# Patient Record
Sex: Male | Born: 2000
Health system: Southern US, Community
[De-identification: ages and names within clinical notes are randomized; demographics above are authoritative.]

## PROBLEM LIST (undated history)

## (undated) DIAGNOSIS — J45909 Unspecified asthma, uncomplicated: Secondary | ICD-10-CM

---
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
## 2022-02-10 ENCOUNTER — Ambulatory Visit
Admission: EM | Admit: 2022-02-10 | Discharge: 2022-02-10 | Disposition: A | Discharge status: Home / Self Care | Payer: BC Managed Care – PPO | Attending: Emergency Medicine | Admitting: Emergency Medicine

## 2022-02-10 DIAGNOSIS — J029 Acute pharyngitis, unspecified: Secondary | ICD-10-CM | POA: Diagnosis present

## 2022-02-10 DIAGNOSIS — B9689 Other specified bacterial agents as the cause of diseases classified elsewhere: Secondary | ICD-10-CM

## 2022-02-10 DIAGNOSIS — J028 Acute pharyngitis due to other specified organisms: Secondary | ICD-10-CM

## 2022-02-10 LAB — POCT RAPID STREP A (OFFICE): Rapid Strep A Screen: NEGATIVE

## 2022-02-10 MED ORDER — IBUPROFEN 800 MG PO TABS
800.0000 mg | ORAL_TABLET | Freq: Once | ORAL | Status: AC
  Administered 2022-02-10: 800 mg via ORAL

## 2022-02-10 MED ORDER — LIDOCAINE VISCOUS HCL 2 % MT SOLN: 15.0000 mL | OROMUCOSAL | 0 refills | Status: DC | PRN

## 2022-02-10 MED ORDER — IBUPROFEN 600 MG PO TABS: 600.0000 mg | ORAL_TABLET | Freq: Three times a day (TID) | ORAL | 0 refills | Status: AC | PRN

## 2022-02-10 MED ORDER — AMOXICILLIN-POT CLAVULANATE 875-125 MG PO TABS: 1.0000 | ORAL_TABLET | Freq: Two times a day (BID) | ORAL | 0 refills | Status: AC

## 2022-02-10 NOTE — ED Provider Notes (Signed)
[facility]    CSN: [id number] Arrival date & time: 02/10/22  1335    HISTORY   Chief Complaint  Patient presents with   Sore Throat   Fever   HPI Kenneth Shepherd is a pleasant, 21 y.o. male who presents to urgent care today. The pt c/o sore throat and subjective fever that started yesterday.  Patient states has not tried anything for his symptoms.  Patient complains of severe pain with swallowing, muffled voice, discomfort when talking.  Patient denies neck pain, ear pain, congestion, cough, body aches, chills, nausea, vomiting, diarrhea, headache.  Patient denies known sick contacts.  The history is provided by the patient.   Past Medical History:  Diagnosis Date   Asthma    There are no problems to display for this patient.  History reviewed. No pertinent surgical history.  Home Medications    Prior to Admission medications   Not on File    Family History History reviewed. No pertinent family history. Social History Social History   Tobacco Use   Smoking status: Never   Smokeless tobacco: Never  Substance Use Topics   Alcohol use: Never   Drug use: Never   Allergies   Keflex [cephalexin]  Review of Systems Review of Systems Pertinent findings revealed after performing a 14 point review of systems has been noted in the history of present illness.  Physical Exam Triage Vital Signs ED Triage Vitals  Enc Vitals Group     BP 04/19/21 0827 (!) 147/82     Pulse Rate 04/19/21 0827 72     Resp 04/19/21 0827 18     Temp 04/19/21 0827 98.3 F (36.8 C)     Temp Source 04/19/21 0827 Oral     SpO2 04/19/21 0827 98 %     Weight --      Height --      Head Circumference --      Peak Flow --      Pain Score 04/19/21 0826 5     Pain Loc --      Pain Edu? --      Excl. in GC? --   No data found.  Updated Vital Signs BP 112/60 (BP Location: Right Arm)   Pulse 91   Temp 99.8 F (37.7 C) (Oral)   Resp 16   SpO2 96%   Physical Exam Vitals and  nursing note reviewed.  Constitutional:      General: He is awake. He is not in acute distress.    Appearance: Normal appearance. He is well-developed, well-groomed and normal weight. He is ill-appearing. He is not toxic-appearing.  HENT:     Head: Normocephalic and atraumatic.     Jaw: There is normal jaw occlusion.     Salivary Glands: Right salivary gland is diffusely enlarged and tender. Left salivary gland is diffusely enlarged and tender.     Right Ear: Hearing and external ear normal.     Left Ear: Hearing and external ear normal.     Ears:     Comments: Bilateral EACs with mild erythema, bilateral TMs are normal    Nose: No mucosal edema, congestion or rhinorrhea.     Right Turbinates: Not enlarged, swollen or pale.     Left Turbinates: Not enlarged or swollen.     Right Sinus: No maxillary sinus tenderness or frontal sinus tenderness.     Left Sinus: No maxillary sinus tenderness or frontal sinus tenderness.     Mouth/Throat:  Lips: Pink. No lesions.     Mouth: Mucous membranes are moist. No oral lesions or angioedema.     Dentition: No gingival swelling.     Tongue: No lesions.     Palate: No mass.     Pharynx: Uvula midline. Pharyngeal swelling, posterior oropharyngeal erythema and uvula swelling (With erythema) present. No oropharyngeal exudate.     Tonsils: No tonsillar exudate. 2+ on the right. 2+ on the left.  Eyes:     Extraocular Movements: Extraocular movements intact.     Conjunctiva/sclera: Conjunctivae normal.     Pupils: Pupils are equal, round, and reactive to light.  Neck:     Thyroid: No thyroid mass, thyromegaly or thyroid tenderness.     Trachea: Tracheal tenderness present. No abnormal tracheal secretions or tracheal deviation.     Comments: Voice is muffled Cardiovascular:     Rate and Rhythm: Normal rate and regular rhythm.     Pulses: Normal pulses.     Heart sounds: Normal heart sounds, S1 normal and S2 normal. No murmur heard.    No friction  rub. No gallop.  Pulmonary:     Effort: Pulmonary effort is normal. No accessory muscle usage, prolonged expiration, respiratory distress or retractions.     Breath sounds: No stridor, decreased air movement or transmitted upper airway sounds. No decreased breath sounds, wheezing, rhonchi or rales.  Abdominal:     General: Bowel sounds are normal.     Palpations: Abdomen is soft.     Tenderness: There is generalized abdominal tenderness. There is no right CVA tenderness, left CVA tenderness or rebound. Negative signs include Murphy's sign.     Hernia: No hernia is present.  Musculoskeletal:        General: No tenderness. Normal range of motion.     Cervical back: Full passive range of motion without pain, normal range of motion and neck supple.     Right lower leg: No edema.     Left lower leg: No edema.  Lymphadenopathy:     Cervical: Cervical adenopathy present.     Right cervical: Superficial cervical adenopathy and deep cervical adenopathy present.     Left cervical: Superficial cervical adenopathy and deep cervical adenopathy present.  Skin:    General: Skin is warm and dry.     Findings: No erythema, lesion or rash.  Neurological:     General: No focal deficit present.     Mental Status: He is alert and oriented to person, place, and time. Mental status is at baseline.  Psychiatric:        Mood and Affect: Mood normal.        Behavior: Behavior normal. Behavior is cooperative.        Thought Content: Thought content normal.        Judgment: Judgment normal.     Visual Acuity Right Eye Distance:   Left Eye Distance:   Bilateral Distance:    Right Eye Near:   Left Eye Near:    Bilateral Near:     [facility] Couse / Diagnostics / Procedures:     Radiology No results found.  Procedures Procedures (including critical care time) EKG  Pending results:  Labs Reviewed  CULTURE, GROUP A STREP [facility])  POCT RAPID STREP A (OFFICE)    Medications Ordered in UC: Medications   ibuprofen (ADVIL) tablet 800 mg (800 mg Oral Given 02/10/22 1358)    [facility] Diagnoses / Final Clinical Impressions(s)   I have reviewed the triage vital  signs and the nursing notes.  Pertinent labs & imaging results that were available during my care of the patient were reviewed by me and considered in my medical decision making (see chart for details).    Final diagnoses:  Sore throat  Acute bacterial pharyngitis   Rapid strep test today is negative, throat culture pending.  Patient provided with cefdinir for 10 days for treatment of bacterial pharyngitis.  Patient provided with lidocaine to swish and swallow for throat pain as well as ibuprofen 600 mg 3 times daily as needed.  Patient further advised that it is likely that his infection is due to staph instead of strep and that the throat culture will be negative.  Patient advised that if he is feeling better after 24 to 36 hours of antibiotics he should complete the full 10-day course regardless of the results of the strep culture.  Return precautions advised.  ED Prescriptions     Medication Sig Dispense Auth. Provider   ibuprofen (ADVIL) 600 MG tablet Take 1 tablet (600 mg total) by mouth every 8 (eight) hours as needed for up to 30 doses for fever, headache, mild pain or moderate pain (Inflammation). Take 1 tablet 3 times daily as needed for inflammation of upper airways and/or pain. 30 tablet Theadora Rama Scales, PA-C   lidocaine (XYLOCAINE) 2 % solution Use as directed 15 mLs in the mouth or throat every 3 (three) hours as needed for mouth pain (Sore throat). 300 mL Theadora Rama Scales, PA-C   amoxicillin-clavulanate (AUGMENTIN) 875-125 MG tablet Take 1 tablet by mouth every 12 (twelve) hours for 10 days. 20 tablet Theadora Rama Scales, PA-C      PDMP not reviewed this encounter.  Disposition Upon Discharge:  Condition: stable for discharge home Home: take medications as prescribed; routine discharge instructions as discussed;  follow up as advised.  Patient presented with an acute illness with associated systemic symptoms and significant discomfort requiring urgent management. In my opinion, this is a condition that a prudent lay person (someone who possesses an average knowledge of health and medicine) may potentially expect to result in complications if not addressed urgently such as respiratory distress, impairment of bodily function or dysfunction of bodily organs.   Routine symptom specific, illness specific and/or disease specific instructions were discussed with the patient and/or caregiver at length.   As such, the patient has been evaluated and assessed, work-up was performed and treatment was provided in alignment with urgent care protocols and evidence based medicine.  Patient/parent/caregiver has been advised that the patient may require follow up for further testing and treatment if the symptoms continue in spite of treatment, as clinically indicated and appropriate.  If the patient was tested for COVID-19, Influenza and/or RSV, then the patient/parent/guardian was advised to isolate at home pending the results of his/her diagnostic coronavirus test and potentially longer if they're positive. I have also advised pt that if his/her COVID-19 test returns positive, it's recommended to self-isolate for at least 10 days after symptoms first appeared AND until fever-free for 24 hours without fever reducer AND other symptoms have improved or resolved. Discussed self-isolation recommendations as well as instructions for household member/close contacts as per the [facility] and [place] [facility], and also gave patient the COVID packet with this information.  Patient/parent/caregiver has been advised to return to the [facility] or PCP in 3-5 days if no better; to PCP or the Emergency Department if new signs and symptoms develop, or if the current signs or symptoms continue to  change or worsen for further workup, evaluation and treatment as  clinically indicated and appropriate  The patient will follow up with their current PCP if and as advised. If the patient does not currently have a PCP we will assist them in obtaining one.   The patient may need specialty follow up if the symptoms continue, in spite of conservative treatment and management, for further workup, evaluation, consultation and treatment as clinically indicated and appropriate.  Patient/parent/caregiver verbalized understanding and agreement of plan as discussed.  All questions were addressed during visit.  Please see discharge instructions below for further details of plan.  Discharge Instructions:   Discharge Instructions      Your strep test today is negative.  Streptococcal throat culture will be performed per our protocol, please keep in mind that the rapid strep test that we perform here at urgent care only catches 40% of strep throat infections.   Based on my physical exam findings and the history you provided to me today, I recommend that you begin antibiotics now for presumed strep throat instead of waiting for the strep culture result.  I have sent a prescription to your pharmacy.  After 24 hours of antibiotics, you should begin to feel significantly better.     After 24 hours of taking antibiotics, please discard your toothbrush as well as any other oral devices that you are currently using and replace them with new ones to avoid reinfection.   If your streptococcal throat culture has a negative result but you feel significantly better after taking antibiotics for 24 to 48 hours, I strongly recommend that you finish the full 10-day course.  Bacterial culture tests are only as reliable as the laboratory technician performing them and they only look for strep, not any of the other common bacteria that causes bacterial infections in our throat.    Alternately, if your streptococcal throat culture has a negative result and you see no improvement of your  symptoms after 24 to 48 hours of antibiotics, please discontinue the antibiotics as they are no longer indicated and will be of no benefit.  Your throat infection will then be most likely considered due to one of the many viruses circulating in our community right now and will simply have to run its course.     Please see the list below for recommended medications, dosages and frequencies to provide relief of your current symptoms:    Augmentin (amoxicillin/clavulanate): For the infection in your throat, please take 1 capsule twice daily for 10 days, you can take it with or without food.  This antibiotic can cause upset stomach, this will resolve once antibiotics are complete.  You are welcome to use a probiotic, eat yogurt, take Imodium while taking this medication.  Please avoid other systemic medications such as Maalox, Pepto-Bismol or milk of magnesia as they can interfere with your body's ability to absorb the antibiotics.  If you have relief of your symptoms after 24 to 36 hours of taking Omnicef, please remember that it is very important that you take antibiotics as prescribed.  If you skip doses or do not complete the full course of antibiotics, you put yourself at significant risk of recurrent infection which can often be worse than your initial infection.  I have sent a prescription to your pharmacy.   Advil, Motrin (ibuprofen): This is a good anti-inflammatory medication which addresses aches, pains and inflammation of the upper airways that causes sinus and nasal congestion as well as in the  lower airways which makes your cough feel tight and sometimes burn.  I recommend that you take 600 mg every 6-8 hours as needed for throat pain.  I have sent a prescription to your pharmacy.   Xylocaine (lidocaine): This is a numbing medication that can be swished for 15 seconds and swallowed.  You can use this every 3 hours while awake to relieve pain in your mouth and throat.  I have sent a prescription for  this medication to your pharmacy.   Please follow-up within the next 7-10 days either with your primary care provider or urgent care if your symptoms do not resolve.  If you do not have a primary care provider, we will assist you in finding one.   Thank you for visiting urgent care today.  We appreciate the opportunity to participate in your care.       This office note has been dictated using Teaching laboratory technician.  Unfortunately, this method of dictation can sometimes lead to typographical or grammatical errors.  I apologize for your inconvenience in advance if this occurs.  Please do not hesitate to reach out to me if clarification is needed.      Theadora Rama Scales, PA-C 02/10/22 1538

## 2022-02-10 NOTE — Discharge Instructions (Addendum)
Your strep test today is negative.  Streptococcal throat culture will be performed per our protocol, please keep in mind that the rapid strep test that we perform here at urgent care only catches 40% of strep throat infections.   Based on my physical exam findings and the history you provided to me today, I recommend that you begin antibiotics now for presumed strep throat instead of waiting for the strep culture result.  I have sent a prescription to your pharmacy.  After 24 hours of antibiotics, you should begin to feel significantly better.     After 24 hours of taking antibiotics, please discard your toothbrush as well as any other oral devices that you are currently using and replace them with new ones to avoid reinfection.   If your streptococcal throat culture has a negative result but you feel significantly better after taking antibiotics for 24 to 48 hours, I strongly recommend that you finish the full 10-day course.  Bacterial culture tests are only as reliable as the laboratory technician performing them and they only look for strep, not any of the other common bacteria that causes bacterial infections in our throat.    Alternately, if your streptococcal throat culture has a negative result and you see no improvement of your symptoms after 24 to 48 hours of antibiotics, please discontinue the antibiotics as they are no longer indicated and will be of no benefit.  Your throat infection will then be most likely considered due to one of the many viruses circulating in our community right now and will simply have to run its course.     Please see the list below for recommended medications, dosages and frequencies to provide relief of your current symptoms:    Augmentin (amoxicillin/clavulanate): For the infection in your throat, please take 1 capsule twice daily for 10 days, you can take it with or without food.  This antibiotic can cause upset stomach, this will resolve once antibiotics are  complete.  You are welcome to use a probiotic, eat yogurt, take Imodium while taking this medication.  Please avoid other systemic medications such as Maalox, Pepto-Bismol or milk of magnesia as they can interfere with your body's ability to absorb the antibiotics.  If you have relief of your symptoms after 24 to 36 hours of taking Omnicef, please remember that it is very important that you take antibiotics as prescribed.  If you skip doses or do not complete the full course of antibiotics, you put yourself at significant risk of recurrent infection which can often be worse than your initial infection.  I have sent a prescription to your pharmacy.   Advil, Motrin (ibuprofen): This is a good anti-inflammatory medication which addresses aches, pains and inflammation of the upper airways that causes sinus and nasal congestion as well as in the lower airways which makes your cough feel tight and sometimes burn.  I recommend that you take 600 mg every 6-8 hours as needed for throat pain.  I have sent a prescription to your pharmacy.   Xylocaine (lidocaine): This is a numbing medication that can be swished for 15 seconds and swallowed.  You can use this every 3 hours while awake to relieve pain in your mouth and throat.  I have sent a prescription for this medication to your pharmacy.   Please follow-up within the next 7-10 days either with your primary care provider or urgent care if your symptoms do not resolve.  If you do not have a primary care  provider, we will assist you in finding one.   Thank you for visiting urgent care today.  We appreciate the opportunity to participate in your care.

## 2022-02-10 NOTE — ED Triage Notes (Signed)
The pt c/o sore throat and fever that started yesterday.   Home interventions: none

## 2022-02-11 LAB — CULTURE, GROUP A STREP (THRC)

## 2022-02-12 LAB — CULTURE, GROUP A STREP (THRC)

## 2022-02-13 LAB — CULTURE, GROUP A STREP (THRC)

## 2022-11-22 IMAGING — CT CT ABD-PELV W/O CM
2 of 6 series · 14 of 46 positions shown, 18 images · non-contrast
Comparison: None.

CLINICAL DATA: Concern for umbilical hernia.

EXAM:
CT ABDOMEN AND PELVIS WITHOUT CONTRAST
TECHNIQUE: Multidetector CT imaging of the abdomen and pelvis was performed
following the standard protocol without IV contrast.

[Series 2: axial st · axial · 0.69mm/px · z∈[-435,-70]mm · 11 of 87 slices shown, 15 images]
[im 9/87  soft-tissue]
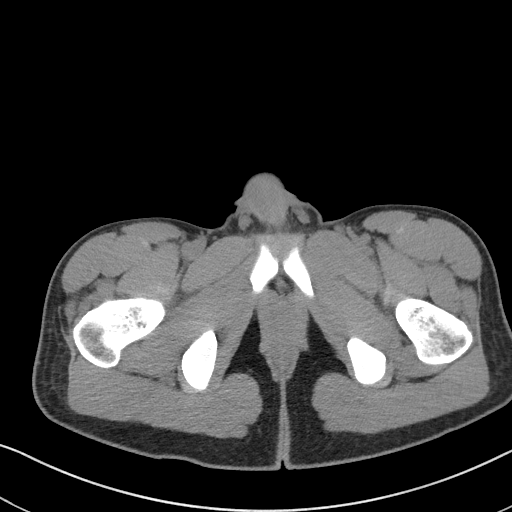
[im 9/87  bone]
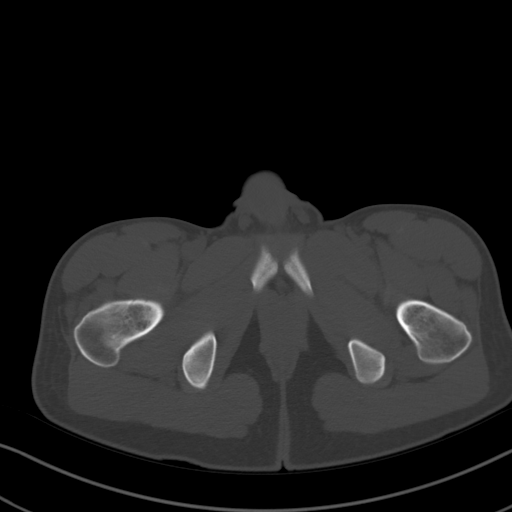
[im 17/87  soft-tissue]
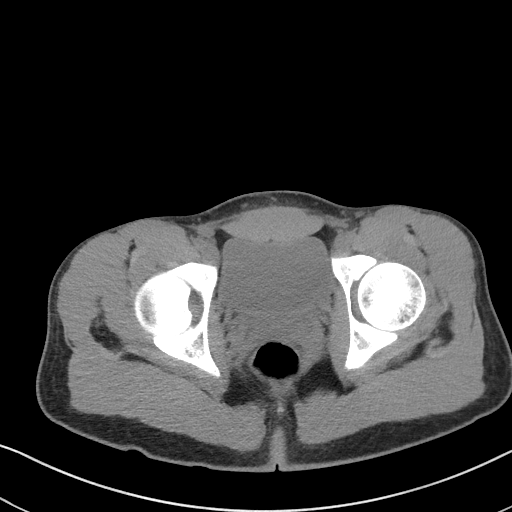
[im 25/87  soft-tissue]
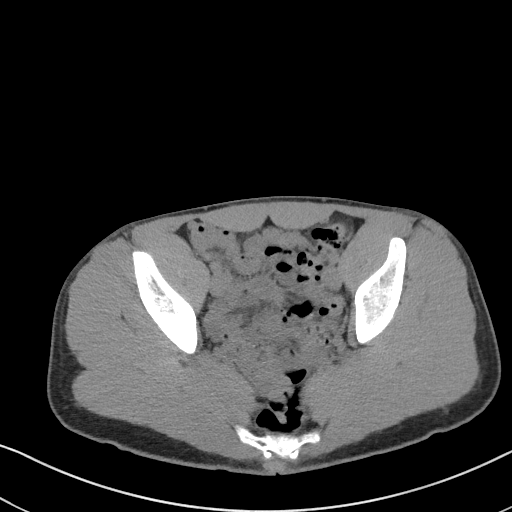
[im 33/87  soft-tissue]
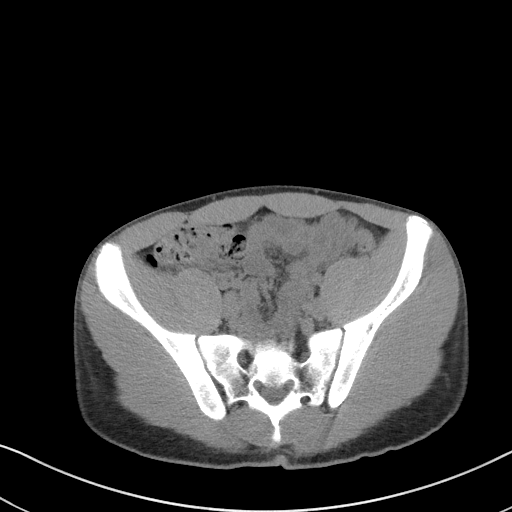
[im 46/87  soft-tissue]
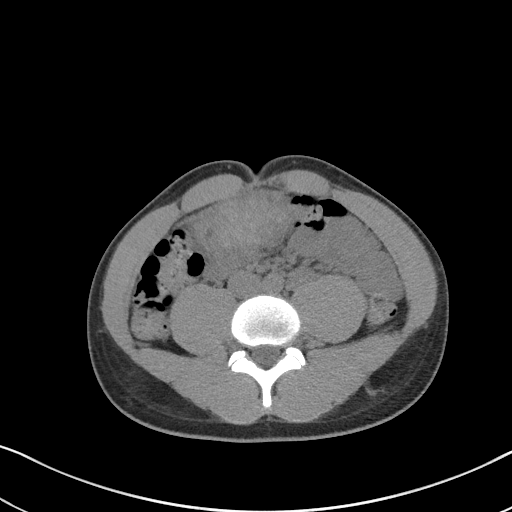
[im 54/87  soft-tissue]
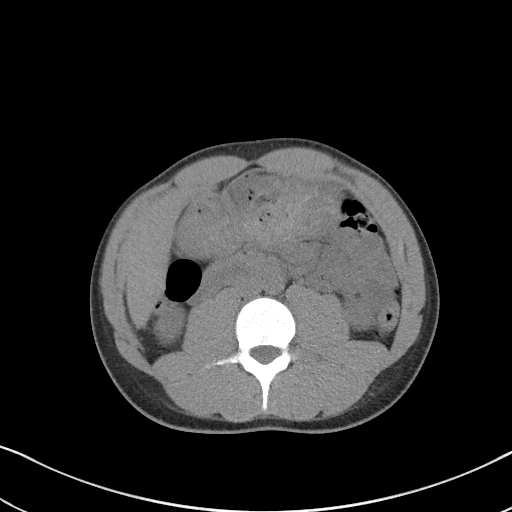
[im 62/87  soft-tissue]
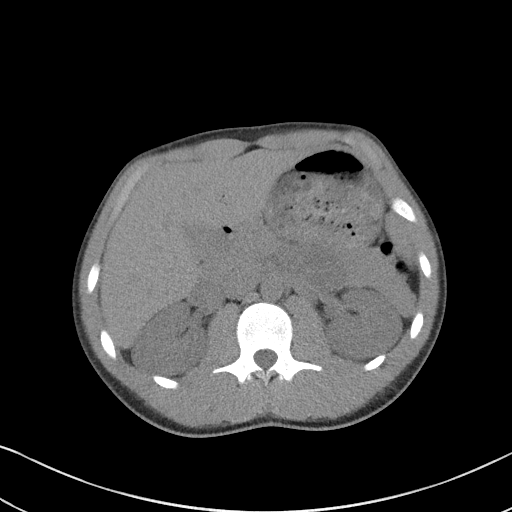
[im 70/87  soft-tissue]
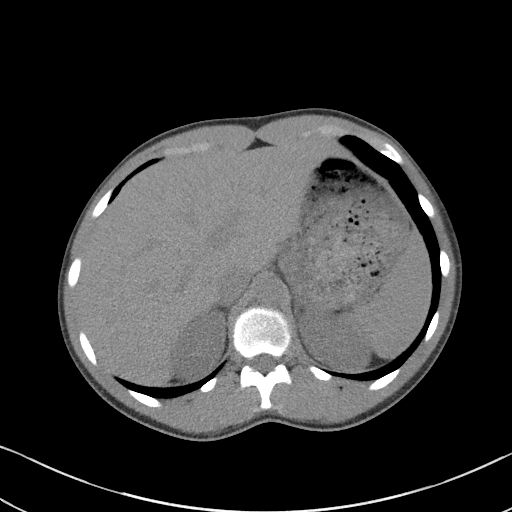
[im 70/87  lung]
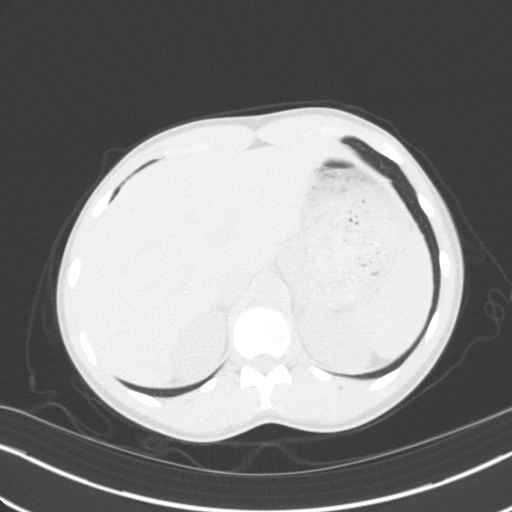
[im 74/87  lung]
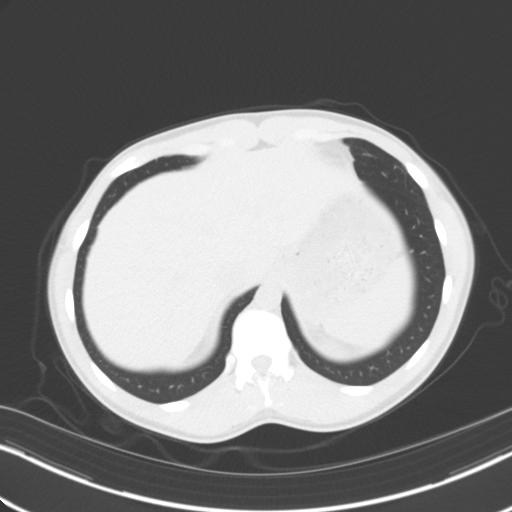
[im 78/87  soft-tissue]
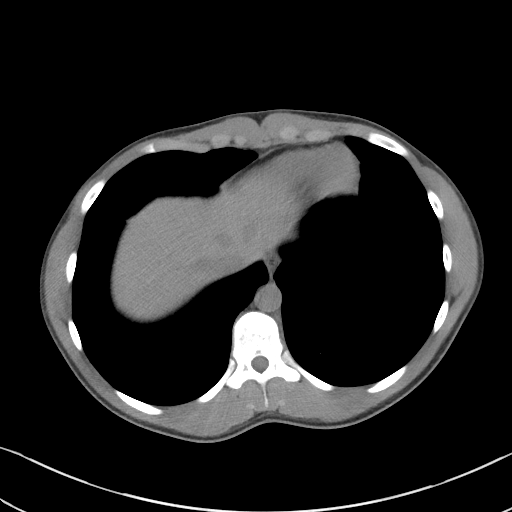
[im 78/87  lung]
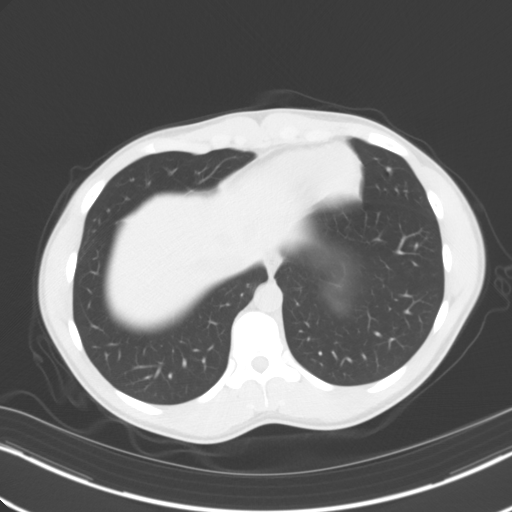
[im 78/87  bone]
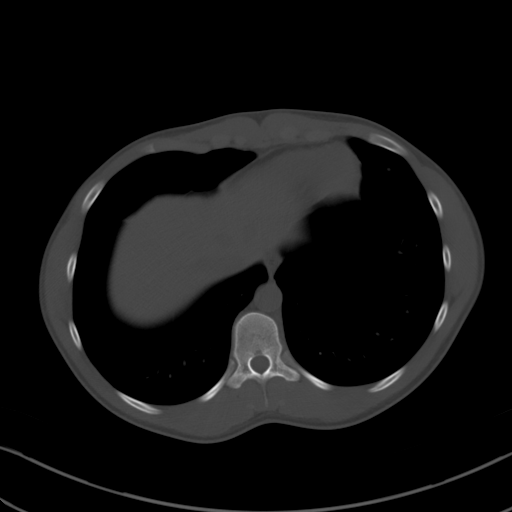
[im 82/87  lung]
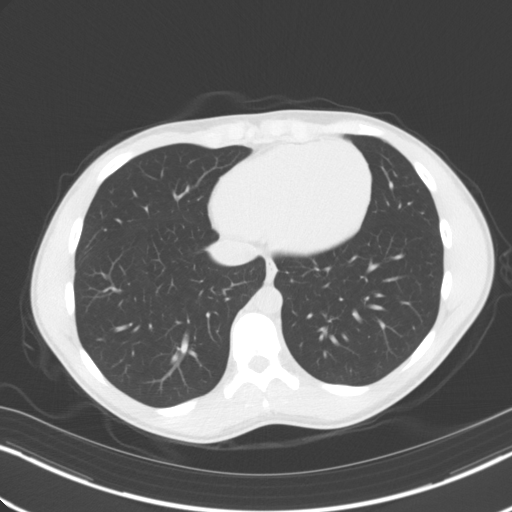

[Series 5: coronal st · coronal · 0.43mm/px · 3 of 101 slices shown]
[im 21/101  soft-tissue]
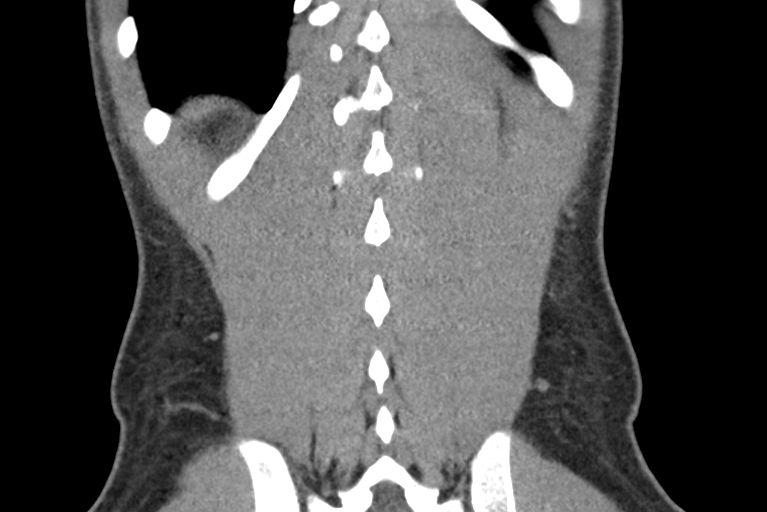
[im 41/101  soft-tissue]
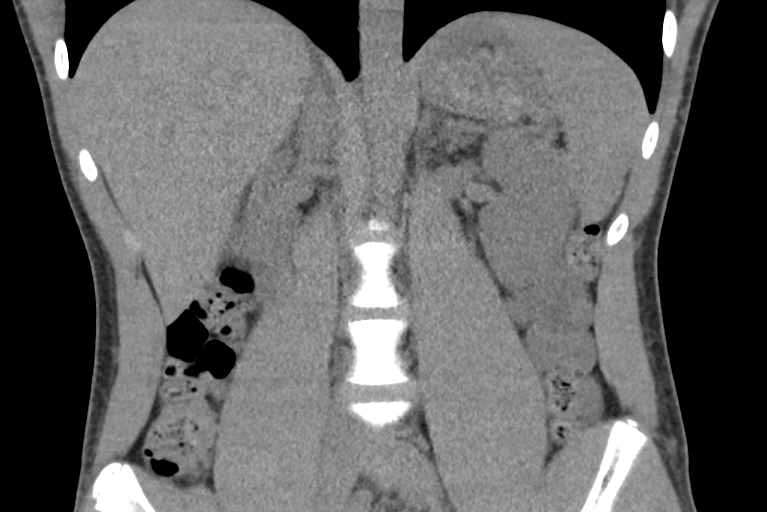
[im 61/101  soft-tissue]
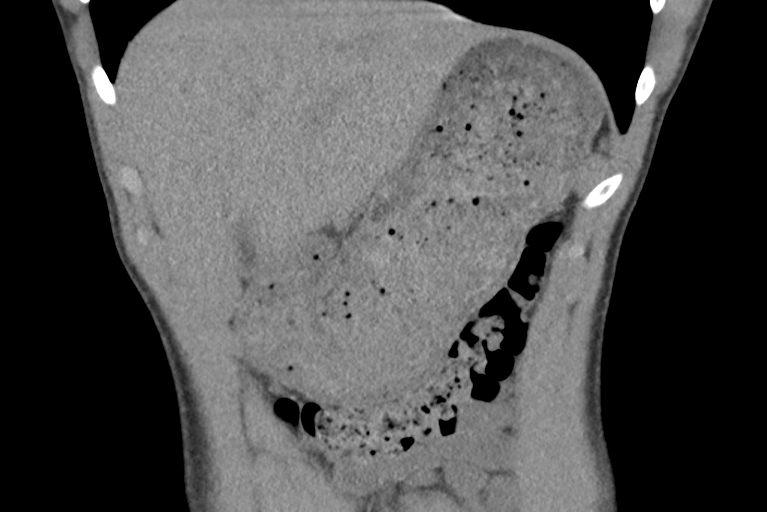

[14 of 46 positions shown; findings below may reference images not displayed]

FINDINGS: Evaluation of this exam is limited in the absence of intravenous
contrast.

Lower chest: The visualized lung bases are clear.

No intra-abdominal free air or free fluid.

Hepatobiliary: No focal liver abnormality is seen. No gallstones,
gallbladder wall thickening, or biliary dilatation.

Pancreas: Unremarkable. No pancreatic ductal dilatation or
surrounding inflammatory changes.

Spleen: Normal in size without focal abnormality.

Adrenals/Urinary Tract: Adrenal glands are unremarkable. Kidneys are
normal, without renal calculi, focal lesion, or hydronephrosis.
Bladder is unremarkable.

Stomach/Bowel: There is moderate stool throughout the colon. There
is no bowel obstruction or active inflammation. The appendix is
normal.

Vascular/Lymphatic: The abdominal aorta and IVC are grossly
unremarkable on this noncontrast CT. No portal venous gas. There is
no adenopathy.

Reproductive: The prostate and seminal vesicles are grossly
unremarkable. No pelvic mass.

Other: None

Musculoskeletal: No acute or significant osseous findings.
IMPRESSION: No acute intra-abdominal or pelvic pathology.  No hernia.

## 2023-12-13 ENCOUNTER — Ambulatory Visit
Admission: EM | Admit: 2023-12-13 | Discharge: 2023-12-13 | Disposition: A | Discharge status: Home / Self Care | Attending: Internal Medicine | Admitting: Internal Medicine

## 2023-12-13 DIAGNOSIS — J020 Streptococcal pharyngitis: Secondary | ICD-10-CM

## 2023-12-13 LAB — POCT RAPID STREP A (OFFICE): Rapid Strep A Screen: POSITIVE — AB

## 2023-12-13 MED ORDER — AMOXICILLIN-POT CLAVULANATE 875-125 MG PO TABS: 1.0000 | ORAL_TABLET | Freq: Two times a day (BID) | ORAL | 0 refills | Status: AC

## 2023-12-13 MED ORDER — PREDNISONE 20 MG PO TABS: 40.0000 mg | ORAL_TABLET | Freq: Every day | ORAL | 0 refills | Status: AC

## 2023-12-13 MED ORDER — LIDOCAINE VISCOUS HCL 2 % MT SOLN: 15.0000 mL | OROMUCOSAL | 0 refills | Status: AC | PRN

## 2023-12-13 NOTE — ED Triage Notes (Signed)
 Patient reports sore throat started yesterday. Patient would like a strep test.

## 2023-12-13 NOTE — Discharge Instructions (Addendum)
 Strep testing done today was positive.  You should avoid working if you are running a fever.  Recommend taking 1 to 2 days off prior to returning.  We will treat with the following: Augmentin  875/125 mg twice daily for 10 days.  This is an antibiotic.  Take this with food.  Lidocaine  15 mL every 3-4 hours as needed for sore throat Prednisone  40 mg (2 tablets) once daily for 5 days. Take this in the morning.  This is a steroid to help with inflammation and pain.  Rest and stay hydrated.   Return to urgent care or PCP if symptoms worsen or fail to resolve.

## 2023-12-13 NOTE — ED Provider Notes (Signed)
 [facility]    CSN: [id number] Arrival date & time: 12/13/23  1200      History   Chief Complaint Chief Complaint  Patient presents with   Sore Throat    HPI Kenneth Shepherd is a 23 y.o. male.   23 year old male who presents urgent care with complaints of a severe sore throat.  This started yesterday.  He has noted some swelling in the back of his throat as well.  He overall does not feel well.  He also reports it is very painful to swallow.  He denies any fevers or chills.  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  Denies any known exposures.   Sore Throat Pertinent negatives include no chest pain, no abdominal pain and no shortness of breath.    Past Medical History:  Diagnosis Date   Asthma     There are no active problems to display for this patient.   History reviewed. No pertinent surgical history.     Home Medications    Prior to Admission medications   Medication Sig Start Date End Date Taking? Authorizing Provider  amoxicillin -clavulanate (AUGMENTIN ) 875-125 MG tablet Take 1 tablet by mouth every 12 (twelve) hours for 10 days. 12/13/23 12/23/23 Yes Jamarien Rodkey A, PA-C  lidocaine  (XYLOCAINE ) 2 % solution Use as directed 15 mLs in the mouth or throat every 4 (four) hours as needed for mouth pain. Avoid eating or drinking for 45 minutes after utilizing this medication 12/13/23  Yes Mardy Hoppe A, PA-C  predniSONE  (DELTASONE ) 20 MG tablet Take 2 tablets (40 mg total) by mouth daily with breakfast for 5 days. 12/13/23 12/18/23 Yes Jacqulynn Shappell A, PA-C  ibuprofen  (ADVIL ) 600 MG tablet Take 1 tablet (600 mg total) by mouth every 8 (eight) hours as needed for up to 30 doses for fever, headache, mild pain or moderate pain (Inflammation). Take 1 tablet 3 times daily as needed for inflammation of upper airways and/or pain. 02/10/22   Joesph Shaver Scales, PA-C    Family History History reviewed. No pertinent family history.  Social History Social History    Tobacco Use   Smoking status: Never   Smokeless tobacco: Never  Substance Use Topics   Alcohol use: Never   Drug use: Never     Allergies   Keflex [cephalexin]   Review of Systems Review of Systems  Constitutional:  Negative for chills and fever.  HENT:  Positive for sore throat and trouble swallowing. Negative for ear pain.   Eyes:  Negative for pain and visual disturbance.  Respiratory:  Negative for cough and shortness of breath.   Cardiovascular:  Negative for chest pain and palpitations.  Gastrointestinal:  Negative for abdominal pain and vomiting.  Genitourinary:  Negative for dysuria and hematuria.  Musculoskeletal:  Negative for arthralgias and back pain.  Skin:  Negative for color change and rash.  Neurological:  Negative for seizures and syncope.  All other systems reviewed and are negative.    Physical Exam Triage Vital Signs ED Triage Vitals  Encounter Vitals Group     BP 12/13/23 1231 (!) 99/56     Girls Systolic BP Percentile --      Girls Diastolic BP Percentile --      Boys Systolic BP Percentile --      Boys Diastolic BP Percentile --      Pulse Rate 12/13/23 1228 (!) 58     Resp 12/13/23 1228 18     Temp 12/13/23 1231 98.4 F (36.9 C)  Temp Source 12/13/23 1228 Oral     SpO2 12/13/23 1228 97 %     Weight --      Height --      Head Circumference --      Peak Flow --      Pain Score --      Pain Loc --      Pain Education --      Exclude from Growth Chart --    No data found.  Updated Vital Signs BP (!) 99/56 (BP Location: Left Arm)   Pulse (!) 58   Temp 98.4 F (36.9 C) (Oral)   Resp 18   SpO2 97%   Visual Acuity Right Eye Distance:   Left Eye Distance:   Bilateral Distance:    Right Eye Near:   Left Eye Near:    Bilateral Near:     Physical Exam Vitals and nursing note reviewed.  Constitutional:      General: He is not in acute distress.    Appearance: He is well-developed.  HENT:     Head: Normocephalic and  atraumatic.     Nose: No congestion.     Mouth/Throat:     Mouth: Mucous membranes are moist.     Pharynx: Pharyngeal swelling, oropharyngeal exudate, posterior oropharyngeal erythema and uvula swelling present.     Tonsils: 2+ on the right. 2+ on the left.   Eyes:     Conjunctiva/sclera: Conjunctivae normal.    Cardiovascular:     Rate and Rhythm: Normal rate and regular rhythm.     Heart sounds: No murmur heard. Pulmonary:     Effort: Pulmonary effort is normal. No respiratory distress.     Breath sounds: Normal breath sounds.  Abdominal:     Palpations: Abdomen is soft.     Tenderness: There is no abdominal tenderness.   Musculoskeletal:        General: No swelling.     Cervical back: Neck supple.   Skin:    General: Skin is warm and dry.     Capillary Refill: Capillary refill takes less than 2 seconds.   Neurological:     Mental Status: He is alert.   Psychiatric:        Mood and Affect: Mood normal.      UC Treatments / Results  Labs (all labs ordered are listed, but only abnormal results are displayed) Labs Reviewed  POCT RAPID STREP A (OFFICE) - Abnormal; Notable for the following components:      Result Value   Rapid Strep A Screen Positive (*)    All other components within normal limits    EKG   Radiology No results found.  Procedures Procedures (including critical care time)  Medications Ordered in UC Medications - No data to display  Initial Impression / Assessment and Plan / UC Course  I have reviewed the triage vital signs and the nursing notes.  Pertinent labs & imaging results that were available during my care of the patient were reviewed by me and considered in my medical decision making (see chart for details).     Strep throat   Strep testing done today was positive.  Physical exam findings does show significant erythema and swelling in the pharynx.   We will treat with the following: Augmentin  875/125 mg twice daily for 10  days.  This is an antibiotic.  Take this with food.  Lidocaine  15 mL every 3-4 hours as needed for sore throat Prednisone  40 mg (  2 tablets) once daily for 5 days. Take this in the morning.  This is a steroid to help with inflammation and pain.  Rest and stay hydrated.   Return to urgent care or PCP if symptoms worsen or fail to resolve.    Final Clinical Impressions(s) / UC Diagnoses   Final diagnoses:  Strep throat     Discharge Instructions      Strep testing done today was positive.  You should avoid working if you are running a fever.  Recommend taking 1 to 2 days off prior to returning.  We will treat with the following: Augmentin  875/125 mg twice daily for 10 days.  This is an antibiotic.  Take this with food.  Lidocaine  15 mL every 3-4 hours as needed for sore throat Prednisone  40 mg (2 tablets) once daily for 5 days. Take this in the morning.  This is a steroid to help with inflammation and pain.  Rest and stay hydrated.   Return to urgent care or PCP if symptoms worsen or fail to resolve.      ED Prescriptions     Medication Sig Dispense Auth. Provider   amoxicillin -clavulanate (AUGMENTIN ) 875-125 MG tablet Take 1 tablet by mouth every 12 (twelve) hours for 10 days. 20 tablet Teresa Norris A, PA-C   predniSONE  (DELTASONE ) 20 MG tablet Take 2 tablets (40 mg total) by mouth daily with breakfast for 5 days. 10 tablet Liviah Cake A, PA-C   lidocaine  (XYLOCAINE ) 2 % solution Use as directed 15 mLs in the mouth or throat every 4 (four) hours as needed for mouth pain. Avoid eating or drinking for 45 minutes after utilizing this medication 100 mL Teresa Norris LABOR, PA-C      PDMP not reviewed this encounter.   Teresa Norris LABOR, PA-C 12/13/23 1306

## 2024-01-18 ENCOUNTER — Emergency Department (HOSPITAL_COMMUNITY)

## 2024-01-18 ENCOUNTER — Encounter (HOSPITAL_COMMUNITY): Payer: Self-pay

## 2024-01-18 ENCOUNTER — Other Ambulatory Visit: Payer: Self-pay

## 2024-01-18 ENCOUNTER — Emergency Department (HOSPITAL_COMMUNITY)
Admission: EM | Admit: 2024-01-18 | Discharge: 2024-01-18 | Disposition: A | Discharge status: Home / Self Care | Attending: Emergency Medicine | Admitting: Emergency Medicine

## 2024-01-18 DIAGNOSIS — F10129 Alcohol abuse with intoxication, unspecified: Secondary | ICD-10-CM | POA: Insufficient documentation

## 2024-01-18 DIAGNOSIS — Y906 Blood alcohol level of 120-199 mg/100 ml: Secondary | ICD-10-CM | POA: Insufficient documentation

## 2024-01-18 DIAGNOSIS — F10920 Alcohol use, unspecified with intoxication, uncomplicated: Secondary | ICD-10-CM

## 2024-01-18 LAB — COMPREHENSIVE METABOLIC PANEL WITH GFR
ALT: 26 U/L (ref 0–44)
AST: 23 U/L (ref 15–41)
Albumin: 4.4 g/dL (ref 3.5–5.0)
Alkaline Phosphatase: 59 U/L (ref 38–126)
Anion gap: 15 (ref 5–15)
BUN: 14 mg/dL (ref 6–20)
CO2: 25 mmol/L (ref 22–32)
Calcium: 10 mg/dL (ref 8.9–10.3)
Chloride: 102 mmol/L (ref 98–111)
Creatinine, Ser: 0.78 mg/dL (ref 0.61–1.24)
GFR, Estimated: >60 mL/min (ref >60)
Glucose, Bld: 96 mg/dL (ref 70–99)
Potassium: 3.4 mmol/L — ABNORMAL LOW (ref 3.5–5.1)
Sodium: 142 mmol/L (ref 135–145)
Total Bilirubin: 0.5 mg/dL (ref 0.0–1.2)
Total Protein: 8.1 g/dL (ref 6.5–8.1)

## 2024-01-18 LAB — URINALYSIS, ROUTINE W REFLEX MICROSCOPIC
Bilirubin Urine: NEGATIVE
Glucose, UA: NEGATIVE mg/dL
Hgb urine dipstick: NEGATIVE
Ketones, ur: NEGATIVE mg/dL
Leukocytes,Ua: NEGATIVE
Nitrite: NEGATIVE
Protein, ur: NEGATIVE mg/dL
Specific Gravity, Urine: 1.013 (ref 1.005–1.030)
pH: 6 (ref 5.0–8.0)

## 2024-01-18 LAB — CBC
HCT: 42.8 % (ref 39.0–52.0)
Hemoglobin: 13.9 g/dL (ref 13.0–17.0)
MCH: 28.3 pg (ref 26.0–34.0)
MCHC: 32.5 g/dL (ref 30.0–36.0)
MCV: 87.2 fL (ref 80.0–100.0)
Platelets: 299 K/uL (ref 150–400)
RBC: 4.91 MIL/uL (ref 4.22–5.81)
RDW: 11.8 % (ref 11.5–15.5)
WBC: 6.3 K/uL (ref 4.0–10.5)
nRBC: 0 % (ref 0.0–0.2)

## 2024-01-18 LAB — RAPID URINE DRUG SCREEN, HOSP PERFORMED
Amphetamines: NOT DETECTED
Barbiturates: NOT DETECTED
Benzodiazepines: NOT DETECTED
Cocaine: NOT DETECTED
Opiates: NOT DETECTED
Tetrahydrocannabinol: POSITIVE — AB

## 2024-01-18 LAB — ETHANOL: Alcohol, Ethyl (B): 180 mg/dL — ABNORMAL HIGH (ref <15)

## 2024-01-18 LAB — LIPASE, BLOOD: Lipase: 32 U/L (ref 11–51)

## 2024-01-18 MED ORDER — ONDANSETRON 4 MG PO TBDP: 4.0000 mg | ORAL_TABLET | Freq: Three times a day (TID) | ORAL | 0 refills | Status: AC | PRN

## 2024-01-18 MED ORDER — ONDANSETRON HCL 4 MG/2ML IJ SOLN: 4.0000 mg | Freq: Once | INTRAMUSCULAR | Status: DC | PRN

## 2024-01-18 NOTE — ED Triage Notes (Signed)
 Pt. Arrives for alcohol intoxication. Admits to drinking 3 four locos and smoking marijuana.

## 2024-01-18 NOTE — Discharge Instructions (Addendum)
 As we discussed, your workup in the ER today was reassuring for acute findings.  Laboratory evaluation and CT imaging did not reveal any emergent concerns.  It did appear that you were quite intoxicated as you were not responsive for several hours.  In the future, I recommend that you avoid drinking alcohol in excess to prevent recurrence of the symptoms.  I have given you a prescription for Zofran  which is a nausea medication you can take as prescribed as needed.  Call your PCP to schedule a close follow-up appointment.  Return if development of any new or worsening symptoms.

## 2024-01-18 NOTE — ED Provider Notes (Signed)
 [place] EMERGENCY DEPARTMENT AT [facility] Provider Note   CSN: [id number] Arrival date & time: 01/18/24  9448     Patient presents with: Alcohol Intoxication   Kenneth Shepherd is a 23 y.o. male.   Patient with no pertinent past medical history presents today with complaints of alcohol intoxication. Patient unresponsive, history provided by EMS personnel who reports patient apparently drank 3 four locos and smoked marijuana.   Level 5 caveat -- patient unresponsive   The history is provided by the patient. No language interpreter was used.  Alcohol Intoxication       Prior to Admission medications   Medication Sig Start Date End Date Taking? Authorizing Provider  ibuprofen  (ADVIL ) 600 MG tablet Take 1 tablet (600 mg total) by mouth every 8 (eight) hours as needed for up to 30 doses for fever, headache, mild pain or moderate pain (Inflammation). Take 1 tablet 3 times daily as needed for inflammation of upper airways and/or pain. 02/10/22   Joesph Shaver Scales, PA-C  lidocaine  (XYLOCAINE ) 2 % solution Use as directed 15 mLs in the mouth or throat every 4 (four) hours as needed for mouth pain. Avoid eating or drinking for 45 minutes after utilizing this medication 12/13/23   Teresa Almarie LABOR, PA-C    Allergies: Keflex [cephalexin]    Review of Systems  Unable to perform ROS: Patient unresponsive    Updated Vital Signs BP (!) 123/93 (BP Location: Right Arm)   Pulse 70   Temp 97.7 F (36.5 C) (Oral)   Resp 18   SpO2 100%   Physical Exam Vitals and nursing note reviewed.  Constitutional:      General: He is not in acute distress.    Appearance: Normal appearance. He is normal weight. He is not ill-appearing, toxic-appearing or diaphoretic.  HENT:     Head: Normocephalic and atraumatic.  Eyes:     Extraocular Movements: Extraocular movements intact.     Pupils: Pupils are equal, round, and reactive to light.  Cardiovascular:     Rate and Rhythm:  Normal rate.  Pulmonary:     Effort: Pulmonary effort is normal. No respiratory distress.  Musculoskeletal:        General: Normal range of motion.     Cervical back: Normal range of motion.  Skin:    General: Skin is warm and dry.  Neurological:     Mental Status: He is alert.     Comments: Patient withdraws to painful stimuli only, moves all extremities  Psychiatric:        Mood and Affect: Mood normal.        Behavior: Behavior normal.     (all labs ordered are listed, but only abnormal results are displayed) Labs Reviewed  COMPREHENSIVE METABOLIC PANEL WITH GFR - Abnormal; Notable for the following components:      Result Value   Potassium 3.4 (*)    All other components within normal limits  ETHANOL - Abnormal; Notable for the following components:   Alcohol, Ethyl (B) 180 (*)    All other components within normal limits  LIPASE, BLOOD  CBC  URINALYSIS, ROUTINE W REFLEX MICROSCOPIC  RAPID URINE DRUG SCREEN, HOSP PERFORMED    EKG: None  Radiology: No results found.   Procedures   Medications Ordered in the ED  ondansetron  (ZOFRAN ) injection 4 mg (has no administration in time range)  Medical Decision Making Amount and/or Complexity of Data Reviewed Labs: ordered. Radiology: ordered.  Risk Prescription drug management.   This patient is a 23 y.o. male who presents to the ED for concern of altered mental status, this involves an extensive number of treatment options, and is a complaint that carries with it a high risk of complications and morbidity. The emergent differential diagnosis prior to evaluation includes, but is not limited to,  Drug-related, hypoxia, hyper/hypoglycemia, encephalopathy, sepsis, DKA/HHS, brain lesion, CVA, seizure, environmental, psychiatric  This is not an exhaustive differential.   Past Medical History / Co-morbidities / Social History:  has a past medical history of Asthma.  Additional  history: Chart reviewed.  Physical Exam: Physical exam performed. The pertinent findings include: patient initially unresponsive, withdraws to painful stimuli only. PERRLA and EOMs intact.   Lab Tests: I ordered, and personally interpreted labs.  The pertinent results include:  K 3.4   Imaging Studies: I ordered imaging studies including CT head. I independently visualized and interpreted imaging which showed   1.  No evidence of an acute intracranial abnormality. 2. Minor paranasal sinus mucosal thickening at the imaged levels.  I agree with the radiologist interpretation.   Disposition:  Patient initially presents unresponsive, reported alcohol and THC use last night. Labs and CT overall benign, plan for patient to metabolize to freedom. Will continue to monitor  12:30 PM: Patient awake, alert and oriented, able to get up and walk to the bathroom. Reports that he drank more alcohol that he has before, did have some vomiting prior to arrival but none since. Feels well to go home and is without any complaints. Will send for zofran  to go home with as needed if he has any more nausea/vomiting but is able to tolerate po here. Evaluation and diagnostic testing in the emergency department does not suggest an emergent condition requiring admission or immediate intervention beyond what has been performed at this time.  Plan for discharge with close PCP follow-up.  Patient is understanding and amenable with plan, educated on red flag symptoms that would prompt immediate return.  Patient discharged in stable condition.  Final diagnoses:  Alcoholic intoxication without complication [facility])    ED Discharge Orders          Ordered    ondansetron  (ZOFRAN -ODT) 4 MG disintegrating tablet  Every 8 hours PRN        01/18/24 1248          An After Visit Summary was printed and given to the patient.      Nora Lauraine DELENA DEVONNA 01/18/24 1249    Randol Simmonds, MD 01/19/24 [phone]

## 2024-03-23 ENCOUNTER — Emergency Department (HOSPITAL_COMMUNITY)
Admission: EM | Admit: 2024-03-23 | Discharge: 2024-03-23 | Disposition: A | Discharge status: Home / Self Care | Attending: Emergency Medicine | Admitting: Emergency Medicine

## 2024-03-23 ENCOUNTER — Other Ambulatory Visit: Payer: Self-pay

## 2024-03-23 ENCOUNTER — Encounter (HOSPITAL_COMMUNITY): Payer: Self-pay

## 2024-03-23 DIAGNOSIS — Z711 Person with feared health complaint in whom no diagnosis is made: Secondary | ICD-10-CM | POA: Diagnosis not present

## 2024-03-23 DIAGNOSIS — Z202 Contact with and (suspected) exposure to infections with a predominantly sexual mode of transmission: Secondary | ICD-10-CM | POA: Diagnosis present

## 2024-03-23 LAB — RPR: RPR Ser Ql: NONREACTIVE

## 2024-03-23 LAB — RAPID HIV SCREEN (HIV 1/2 AB+AG)
HIV 1/2 Antibodies: NONREACTIVE
HIV-1 P24 Antigen - HIV24: NONREACTIVE

## 2024-03-23 MED ORDER — STERILE WATER FOR INJECTION IJ SOLN
INTRAMUSCULAR | Status: AC
  Filled 2024-03-23: qty 10

## 2024-03-23 MED ORDER — DOXYCYCLINE HYCLATE 100 MG PO CAPS: 100.0000 mg | ORAL_CAPSULE | Freq: Two times a day (BID) | ORAL | 0 refills | Status: AC

## 2024-03-23 MED ORDER — CEFTRIAXONE SODIUM 1 G IJ SOLR
500.0000 mg | Freq: Once | INTRAMUSCULAR | Status: AC
Start: 2024-03-23 — End: 2024-03-23
  Administered 2024-03-23: 500 mg via INTRAMUSCULAR
  Filled 2024-03-23: qty 10

## 2024-03-23 MED ORDER — DOXYCYCLINE HYCLATE 100 MG PO TABS
100.0000 mg | ORAL_TABLET | Freq: Once | ORAL | Status: AC
  Administered 2024-03-23: 100 mg via ORAL
  Filled 2024-03-23: qty 1

## 2024-03-23 NOTE — Discharge Instructions (Addendum)
 HIV testing was nonreactive.  Gonorrhea and Chlamydia testing will be available on the patient portal.  You have been empirically treated for both gonorrhea and chlamydia.  The treatment for chlamydia is a 7-day course of doxycycline.

## 2024-03-23 NOTE — ED Triage Notes (Signed)
 Pt presents to ED from home requesting testing/treatment for gonorrhea, states partner tested positive yesterday. Also requesting to be checked for HIV.

## 2024-03-23 NOTE — ED Provider Notes (Signed)
 [place] EMERGENCY DEPARTMENT AT [facility] Provider Note   CSN: [id number] Arrival date & time: 03/23/24  9096     Patient presents with: Exposure to STD   Kenneth Shepherd is a 23 y.o. male.    Exposure to STD     23 year old male presenting to the emergency department for STI prophylaxis and testing.  The patient states that he had a sexual partner who recently tested positive for gonorrhea.  He denies any symptoms at this time.  He denies any penile lesions, penile discharge, dysuria or frequency.  He denies any testicular pain.  He consents to STI testing as well as empiric management.  He states he has a remote allergy to Keflex, does not remember his reaction but believes it was mild.  Prior to Admission medications   Medication Sig Start Date End Date Taking? Authorizing Provider  doxycycline (VIBRAMYCIN) 100 MG capsule Take 1 capsule (100 mg total) by mouth 2 (two) times daily for 7 days. 03/23/24 03/30/24 Yes Jerrol Agent, MD  ibuprofen  (ADVIL ) 600 MG tablet Take 1 tablet (600 mg total) by mouth every 8 (eight) hours as needed for up to 30 doses for fever, headache, mild pain or moderate pain (Inflammation). Take 1 tablet 3 times daily as needed for inflammation of upper airways and/or pain. 02/10/22   Joesph Shaver Scales, PA-C  lidocaine  (XYLOCAINE ) 2 % solution Use as directed 15 mLs in the mouth or throat every 4 (four) hours as needed for mouth pain. Avoid eating or drinking for 45 minutes after utilizing this medication 12/13/23   Teresa Norris A, PA-C  ondansetron  (ZOFRAN -ODT) 4 MG disintegrating tablet Take 1 tablet (4 mg total) by mouth every 8 (eight) hours as needed. 01/18/24   Smoot, Lauraine LABOR, PA-C    Allergies: Keflex [cephalexin]    Review of Systems  All other systems reviewed and are negative.   Updated Vital Signs BP 133/87 (BP Location: Right Arm)   Pulse 69   Temp 98.3 F (36.8 C) (Oral)   Resp 18   Ht 5' 8 (1.727 m)   Wt 59 kg    SpO2 99%   BMI 19.77 kg/m   Physical Exam Vitals and nursing note reviewed.  Constitutional:      General: He is not in acute distress. HENT:     Head: Normocephalic and atraumatic.  Eyes:     Conjunctiva/sclera: Conjunctivae normal.     Pupils: Pupils are equal, round, and reactive to light.  Cardiovascular:     Rate and Rhythm: Normal rate and regular rhythm.  Pulmonary:     Effort: Pulmonary effort is normal. No respiratory distress.     Breath sounds: Normal breath sounds.  Abdominal:     General: There is no distension.     Tenderness: There is no abdominal tenderness. There is no guarding.  Genitourinary:    Comments: Pt declined GU exam as he is asymptomatic Musculoskeletal:        General: No deformity or signs of injury.     Cervical back: Neck supple.  Skin:    Findings: No lesion or rash.  Neurological:     General: No focal deficit present.     Mental Status: He is alert. Mental status is at baseline.     (all labs ordered are listed, but only abnormal results are displayed) Labs Reviewed  RAPID HIV SCREEN (HIV 1/2 AB+AG)  RPR  GC/CHLAMYDIA PROBE AMP ([place]) NOT AT [facility]    EKG:  None  Radiology: No results found.   Procedures   Medications Ordered in the ED  cefTRIAXone (ROCEPHIN) injection 500 mg (500 mg Intramuscular Given 03/23/24 1117)  doxycycline (VIBRA-TABS) tablet 100 mg (100 mg Oral Given 03/23/24 1115)  sterile water (preservative free) injection (  Given 03/23/24 1117)                                    Medical Decision Making Amount and/or Complexity of Data Reviewed Labs: ordered.  Risk Prescription drug management.    23 year old male presenting to the emergency department for STI prophylaxis and testing.  The patient states that he had a sexual partner who recently tested positive for gonorrhea.  He denies any symptoms at this time.  He denies any penile lesions, penile discharge, dysuria or frequency.  He denies any  testicular pain.  He consents to STI testing as well as empiric management.  He states he has a remote allergy to Keflex, does not remember his reaction but believes it was mild.  On arrival, the patient was vitally stable.  Physical exam unremarkable.  Patient declined GU exam as he is asymptomatic at this time.  Requesting empiric management for STIs and testing.  HIV testing was collected and resulted negative, RPR collected and pending.  Patient informed that results of diagnostic testing will be available on the patient portal.  GC/chlamydia urine testing was collected.  Patient administered Rocephin and observed status post Rocephin administration given his remote history of Keflex allergy.  Also administered doxycycline for group treatment for chlamydia.  Advised outpatient follow-up, return precautions provided.  Patient monitored post Rocephin administration for over an hour with no evidence of an allergic reaction.  Overall stable for discharge at this time.     Final diagnoses:  Concern about STD in male without diagnosis    ED Discharge Orders          Ordered    doxycycline (VIBRAMYCIN) 100 MG capsule  2 times daily        03/23/24 1243               Jerrol Agent, MD 03/23/24 1243

## 2024-03-24 LAB — GC/CHLAMYDIA PROBE AMP (~~LOC~~) NOT AT ARMC
Chlamydia: NEGATIVE
Comment: NEGATIVE
Comment: NORMAL
Neisseria Gonorrhea: NEGATIVE

## 2024-05-14 ENCOUNTER — Emergency Department (HOSPITAL_COMMUNITY)
Admission: EM | Admit: 2024-05-14 | Discharge: 2024-05-14 | Disposition: A | Discharge status: Home / Self Care | Attending: Emergency Medicine | Admitting: Emergency Medicine

## 2024-05-14 ENCOUNTER — Emergency Department (HOSPITAL_COMMUNITY)

## 2024-05-14 ENCOUNTER — Other Ambulatory Visit: Payer: Self-pay

## 2024-05-14 ENCOUNTER — Encounter (HOSPITAL_COMMUNITY): Payer: Self-pay | Admitting: Emergency Medicine

## 2024-05-14 DIAGNOSIS — R059 Cough, unspecified: Secondary | ICD-10-CM | POA: Diagnosis present

## 2024-05-14 DIAGNOSIS — J4521 Mild intermittent asthma with (acute) exacerbation: Secondary | ICD-10-CM | POA: Diagnosis not present

## 2024-05-14 LAB — RESP PANEL BY RT-PCR (RSV, FLU A&B, COVID)  RVPGX2
Influenza A by PCR: NEGATIVE
Influenza B by PCR: NEGATIVE
Resp Syncytial Virus by PCR: NEGATIVE
SARS Coronavirus 2 by RT PCR: NEGATIVE

## 2024-05-14 MED ORDER — ALBUTEROL SULFATE HFA 108 (90 BASE) MCG/ACT IN AERS: 2.0000 | INHALATION_SPRAY | RESPIRATORY_TRACT | 0 refills | Status: AC | PRN

## 2024-05-14 MED ORDER — IPRATROPIUM-ALBUTEROL 0.5-2.5 (3) MG/3ML IN SOLN
3.0000 mL | Freq: Once | RESPIRATORY_TRACT | Status: AC
  Administered 2024-05-14: 3 mL via RESPIRATORY_TRACT
  Filled 2024-05-14: qty 3

## 2024-05-14 MED ORDER — PREDNISONE 10 MG PO TABS
40.0000 mg | ORAL_TABLET | Freq: Every day | ORAL | 0 refills | Status: AC
Start: 2024-05-14 — End: ?

## 2024-05-14 MED ORDER — ALBUTEROL SULFATE HFA 108 (90 BASE) MCG/ACT IN AERS
2.0000 | INHALATION_SPRAY | Freq: Once | RESPIRATORY_TRACT | Status: AC
  Administered 2024-05-14: 2 via RESPIRATORY_TRACT
  Filled 2024-05-14: qty 6.7

## 2024-05-14 MED ORDER — PREDNISONE 20 MG PO TABS
40.0000 mg | ORAL_TABLET | Freq: Once | ORAL | Status: AC
  Administered 2024-05-14: 40 mg via ORAL
  Filled 2024-05-14: qty 2

## 2024-05-14 NOTE — ED Provider Notes (Signed)
 [place] EMERGENCY DEPARTMENT AT [facility] Provider Note   CSN: [id number] Arrival date & time: 05/14/24  0344     Patient presents with: URI   Kenneth Shepherd is a 23 y.o. male.   The history is provided by the patient.  URI Kenneth Shepherd is a 23 y.o. male who presents to the Emergency Department complaining of see by EMS from work for evaluation of shortness of breath that started at 1-1/2 hours ago while he was working.  He has associated chest tightness.  He reports 2 to 3 days of cough productive of mucus.  No fever.  No abdominal pain, vomiting, diarrhea, leg swelling or pain.  He does have a history of asthma but is out of his inhaler.  No history of DVT/PE.   Prior to Admission medications   Medication Sig Start Date End Date Taking? Authorizing Provider  albuterol  (VENTOLIN  HFA) 108 (90 Base) MCG/ACT inhaler Inhale 2 puffs into the lungs every 4 (four) hours as needed for wheezing or shortness of breath. 05/14/24  Yes Griselda Norris, MD  predniSONE  (DELTASONE ) 10 MG tablet Take 4 tablets (40 mg total) by mouth daily. 05/14/24  Yes Griselda Norris, MD  ibuprofen  (ADVIL ) 600 MG tablet Take 1 tablet (600 mg total) by mouth every 8 (eight) hours as needed for up to 30 doses for fever, headache, mild pain or moderate pain (Inflammation). Take 1 tablet 3 times daily as needed for inflammation of upper airways and/or pain. 02/10/22   Joesph Shaver Scales, PA-C  lidocaine  (XYLOCAINE ) 2 % solution Use as directed 15 mLs in the mouth or throat every 4 (four) hours as needed for mouth pain. Avoid eating or drinking for 45 minutes after utilizing this medication 12/13/23   Teresa Norris A, PA-C  ondansetron  (ZOFRAN -ODT) 4 MG disintegrating tablet Take 1 tablet (4 mg total) by mouth every 8 (eight) hours as needed. 01/18/24   Smoot, Lauraine LABOR, PA-C    Allergies: Keflex [cephalexin]    Review of Systems  All other systems reviewed and are negative.   Updated Vital  Signs BP 117/87 (BP Location: Left Arm)   Pulse (!) 103   Temp 98.1 F (36.7 C) (Oral)   Resp (!) 21   SpO2 100%   Physical Exam Vitals and nursing note reviewed.  Constitutional:      Appearance: He is well-developed.  HENT:     Head: Normocephalic and atraumatic.  Cardiovascular:     Rate and Rhythm: Normal rate and regular rhythm.     Heart sounds: No murmur heard. Pulmonary:     Effort: Pulmonary effort is normal. No respiratory distress.     Comments: Occasional wheeze in right lower lung fields Abdominal:     Palpations: Abdomen is soft.     Tenderness: There is no abdominal tenderness. There is no guarding or rebound.  Musculoskeletal:        General: No swelling or tenderness.  Skin:    General: Skin is warm and dry.  Neurological:     Mental Status: He is alert and oriented to person, place, and time.  Psychiatric:        Behavior: Behavior normal.     (all labs ordered are listed, but only abnormal results are displayed) Labs Reviewed  RESP PANEL BY RT-PCR (RSV, FLU A&B, COVID)  [id number]    EKG: EKG Interpretation Date/Time:  Saturday May 14 2024 05:13:35 EST Ventricular Rate:  72 PR Interval:  153 QRS Duration:  81 QT  Interval:  365 QTC Calculation: 400 R Axis:   76  Text Interpretation: Sinus rhythm Consider left atrial enlargement LVH by voltage Confirmed by Griselda Norris [phone]) on 05/14/2024 5:15:08 AM  Radiology: DG Chest 2 View Result Date: 05/14/2024 EXAM: 2 VIEW(S) XRAY OF THE CHEST 05/14/2024 04:12:00 AM COMPARISON: 05/24/2018 CLINICAL HISTORY: URI FINDINGS: LUNGS AND PLEURA: No focal pulmonary opacity. No pleural effusion. No pneumothorax. HEART AND MEDIASTINUM: No acute abnormality of the cardiac and mediastinal silhouettes. BONES AND SOFT TISSUES: Unremarkable visualized skeletal structures except for slight dextrocurvature of thoracic spine. IMPRESSION: 1. No acute cardiopulmonary process. Electronically signed by: Waddell Calk MD  05/14/2024 04:24 AM EST RP Workstation: [id number]     Procedures   Medications Ordered in the ED  albuterol  (VENTOLIN  HFA) 108 (90 Base) MCG/ACT inhaler 2 puff (has no administration in time range)  predniSONE  (DELTASONE ) tablet 40 mg (40 mg Oral Given 05/14/24 0453)  ipratropium-albuterol  (DUONEB) 0.5-2.5 (3) MG/3ML nebulizer solution 3 mL (3 mLs Nebulization Given 05/14/24 0454)                                    Medical Decision Making Amount and/or Complexity of Data Reviewed Radiology: ordered.  Risk Prescription drug management.   Patient with history of asthma here for evaluation of increased cough, shortness of breath.  He does not have a home inhaler.  Patient with occasional right-sided wheezes on examination, improved after nebulizer use.  He does have persistent occasional expiratory wheeze on exam.  No respiratory distress.  Chest x-ray is negative for acute process-images personally reviewed and interpreted, agree with radiologist interpretation.  Current picture is not consistent with anemia, PE, CHF.  Discussed with patient home care for asthma exacerbation.  Discussed outpatient follow-up and return precautions.     Final diagnoses:  Mild intermittent asthma with exacerbation    ED Discharge Orders          Ordered    predniSONE  (DELTASONE ) 10 MG tablet  Daily        05/14/24 0539    albuterol  (VENTOLIN  HFA) 108 (90 Base) MCG/ACT inhaler  Every 4 hours PRN        05/14/24 0539               Griselda Norris, MD 05/14/24 [phone]

## 2024-05-14 NOTE — ED Triage Notes (Signed)
 Patient BIB EMS from work c/o URI x 2 days. Patient report worsening congestion while working tonight. Patient denies fever. Patient states hx of asthma and unable to use his inhaler for 2 years. Patient denies chest pain.
